# Patient Record
Sex: Male | Born: 1985 | Race: White | Hispanic: No | Marital: Married | Smoking: Never smoker
Health system: Southern US, Community
[De-identification: ages and names within clinical notes are randomized; demographics above are authoritative.]

## PROBLEM LIST (undated history)

## (undated) DIAGNOSIS — F988 Other specified behavioral and emotional disorders with onset usually occurring in childhood and adolescence: Secondary | ICD-10-CM

## (undated) DIAGNOSIS — F32A Depression, unspecified: Secondary | ICD-10-CM

## (undated) DIAGNOSIS — K219 Gastro-esophageal reflux disease without esophagitis: Secondary | ICD-10-CM

## (undated) DIAGNOSIS — T7840XA Allergy, unspecified, initial encounter: Secondary | ICD-10-CM

## (undated) DIAGNOSIS — F419 Anxiety disorder, unspecified: Secondary | ICD-10-CM

## (undated) DIAGNOSIS — E785 Hyperlipidemia, unspecified: Secondary | ICD-10-CM

## (undated) DIAGNOSIS — J069 Acute upper respiratory infection, unspecified: Secondary | ICD-10-CM

## (undated) DIAGNOSIS — R739 Hyperglycemia, unspecified: Secondary | ICD-10-CM

## (undated) DIAGNOSIS — K5792 Diverticulitis of intestine, part unspecified, without perforation or abscess without bleeding: Secondary | ICD-10-CM

## (undated) HISTORY — DX: Allergy, unspecified, initial encounter: T78.40XA

## (undated) HISTORY — DX: Acute upper respiratory infection, unspecified: J06.9

## (undated) HISTORY — DX: Diverticulitis of intestine, part unspecified, without perforation or abscess without bleeding: K57.92

## (undated) HISTORY — DX: Other specified behavioral and emotional disorders with onset usually occurring in childhood and adolescence: F98.8

## (undated) HISTORY — DX: Gastro-esophageal reflux disease without esophagitis: K21.9

## (undated) HISTORY — DX: Anxiety disorder, unspecified: F41.9

## (undated) HISTORY — DX: Hyperglycemia, unspecified: R73.9

## (undated) HISTORY — DX: Hyperlipidemia, unspecified: E78.5

## (undated) HISTORY — DX: Depression, unspecified: F32.A

---

## 2007-04-08 HISTORY — PX: SHOULDER SURGERY: SHX246

## 2020-11-05 ENCOUNTER — Encounter (HOSPITAL_BASED_OUTPATIENT_CLINIC_OR_DEPARTMENT_OTHER): Payer: Self-pay

## 2020-11-05 ENCOUNTER — Other Ambulatory Visit: Payer: Self-pay

## 2020-11-05 DIAGNOSIS — K5732 Diverticulitis of large intestine without perforation or abscess without bleeding: Secondary | ICD-10-CM | POA: Insufficient documentation

## 2020-11-05 DIAGNOSIS — R1084 Generalized abdominal pain: Secondary | ICD-10-CM | POA: Diagnosis present

## 2020-11-05 LAB — CBC
HCT: 43.9 % (ref 39.0–52.0)
Hemoglobin: 15.3 g/dL (ref 13.0–17.0)
MCH: 29.3 pg (ref 26.0–34.0)
MCHC: 34.9 g/dL (ref 30.0–36.0)
MCV: 84.1 fL (ref 80.0–100.0)
Platelets: 263 10*3/uL (ref 150–400)
RBC: 5.22 MIL/uL (ref 4.22–5.81)
RDW: 13.2 % (ref 11.5–15.5)
WBC: 12.1 10*3/uL — ABNORMAL HIGH (ref 4.0–10.5)
nRBC: 0 % (ref 0.0–0.2)

## 2020-11-05 LAB — COMPREHENSIVE METABOLIC PANEL
ALT: 20 U/L (ref 0–44)
AST: 20 U/L (ref 15–41)
Albumin: 4.3 g/dL (ref 3.5–5.0)
Alkaline Phosphatase: 48 U/L (ref 38–126)
Anion gap: 10 (ref 5–15)
BUN: 16 mg/dL (ref 6–20)
CO2: 24 mmol/L (ref 22–32)
Calcium: 9.4 mg/dL (ref 8.9–10.3)
Chloride: 104 mmol/L (ref 98–111)
Creatinine, Ser: 0.99 mg/dL (ref 0.61–1.24)
GFR, Estimated: >60 mL/min (ref >60)
Glucose, Bld: 106 mg/dL — ABNORMAL HIGH (ref 70–99)
Potassium: 3.9 mmol/L (ref 3.5–5.1)
Sodium: 138 mmol/L (ref 135–145)
Total Bilirubin: 1 mg/dL (ref 0.3–1.2)
Total Protein: 7.8 g/dL (ref 6.5–8.1)

## 2020-11-05 LAB — URINALYSIS, ROUTINE W REFLEX MICROSCOPIC
Bilirubin Urine: NEGATIVE
Glucose, UA: NEGATIVE mg/dL
Ketones, ur: NEGATIVE mg/dL
Leukocytes,Ua: NEGATIVE
Nitrite: NEGATIVE
Specific Gravity, Urine: 1.037 — ABNORMAL HIGH (ref 1.005–1.030)
pH: 6 (ref 5.0–8.0)

## 2020-11-05 LAB — LIPASE, BLOOD: Lipase: 37 U/L (ref 11–51)

## 2020-11-05 NOTE — ED Triage Notes (Signed)
Patient here POV from Home with ABD Pain.  Patient states pain is primarily located in the [place] and has been present and worsening for 24 Hours PTA.  No N/V/D. No CP, No SOB. Painful with Urination.

## 2020-11-06 ENCOUNTER — Emergency Department (HOSPITAL_BASED_OUTPATIENT_CLINIC_OR_DEPARTMENT_OTHER)
Admission: EM | Admit: 2020-11-06 | Discharge: 2020-11-06 | Disposition: A | Discharge status: Home / Self Care | Payer: BC Managed Care – PPO | Attending: Emergency Medicine | Admitting: Emergency Medicine

## 2020-11-06 ENCOUNTER — Encounter (HOSPITAL_BASED_OUTPATIENT_CLINIC_OR_DEPARTMENT_OTHER): Payer: Self-pay | Admitting: Radiology

## 2020-11-06 ENCOUNTER — Emergency Department (HOSPITAL_BASED_OUTPATIENT_CLINIC_OR_DEPARTMENT_OTHER): Payer: BC Managed Care – PPO

## 2020-11-06 DIAGNOSIS — K5732 Diverticulitis of large intestine without perforation or abscess without bleeding: Secondary | ICD-10-CM

## 2020-11-06 MED ORDER — METRONIDAZOLE 500 MG PO TABS: 500.0000 mg | ORAL_TABLET | Freq: Two times a day (BID) | ORAL | 0 refills | Status: DC

## 2020-11-06 MED ORDER — METRONIDAZOLE 500 MG PO TABS
500.0000 mg | ORAL_TABLET | Freq: Once | ORAL | Status: AC
  Administered 2020-11-06: 500 mg via ORAL
  Filled 2020-11-06: qty 1

## 2020-11-06 MED ORDER — IOHEXOL 300 MG/ML  SOLN
100.0000 mL | Freq: Once | INTRAMUSCULAR | Status: AC | PRN
  Administered 2020-11-06: 100 mL via INTRAVENOUS

## 2020-11-06 MED ORDER — CIPROFLOXACIN HCL 500 MG PO TABS
500.0000 mg | ORAL_TABLET | Freq: Once | ORAL | Status: AC
  Administered 2020-11-06: 500 mg via ORAL
  Filled 2020-11-06: qty 1

## 2020-11-06 MED ORDER — ONDANSETRON HCL 4 MG/2ML IJ SOLN
4.0000 mg | Freq: Once | INTRAMUSCULAR | Status: AC
  Administered 2020-11-06: 4 mg via INTRAVENOUS
  Filled 2020-11-06: qty 2

## 2020-11-06 MED ORDER — HYDROCODONE-ACETAMINOPHEN 5-325 MG PO TABS: 1.0000 | ORAL_TABLET | Freq: Four times a day (QID) | ORAL | 0 refills | Status: DC | PRN

## 2020-11-06 MED ORDER — CIPROFLOXACIN HCL 500 MG PO TABS: 500.0000 mg | ORAL_TABLET | Freq: Two times a day (BID) | ORAL | 0 refills | Status: DC

## 2020-11-06 MED ORDER — FENTANYL CITRATE (PF) 100 MCG/2ML IJ SOLN
100.0000 ug | Freq: Once | INTRAMUSCULAR | Status: AC
  Administered 2020-11-06: 100 ug via INTRAVENOUS
  Filled 2020-11-06: qty 2

## 2020-11-06 NOTE — ED Notes (Signed)
This RN presented the AVS utilizing Teachback Method. Patient verbalizes understanding of Discharge Instructions. Opportunity for Questioning and Answers were provided. Patient Discharged from ED ambulatory to Home via SELF.

## 2020-11-06 NOTE — ED Provider Notes (Signed)
[facility] Provider Note   CSN: [id number] Arrival date & time: 11/05/20  2142     History Chief Complaint  Patient presents with   Abdominal Pain    Christopher Carney is a 35 y.o. male.  The history is provided by the patient.  Abdominal Pain Pain location:  Generalized Pain quality: aching, bloating and cramping   Pain severity:  Moderate Onset quality:  Gradual Duration:  24 hours Timing:  Constant Progression:  Worsening Chronicity:  New Relieved by:  Nothing Worsened by:  Movement and palpation Associated symptoms: constipation and dysuria   Associated symptoms: no fever and no vomiting   Patient reports pain was generalized and now is focal in the right lower quadrant.  He reports feeling gas and feeling constipated      PMH-none Social History   Tobacco Use   Smoking status: Never   Smokeless tobacco: Never  Substance Use Topics   Alcohol use: Yes    Comment: Glass of Wine Daily   Drug use: Never    Home Medications Prior to Admission medications   Medication Sig Start Date End Date Taking? Authorizing Provider  ciprofloxacin (CIPRO) 500 MG tablet Take 1 tablet (500 mg total) by mouth every 12 (twelve) hours. 11/06/20  Yes Ripley Fraise, MD  HYDROcodone-acetaminophen (NORCO/VICODIN) 5-325 MG tablet Take 1 tablet by mouth every 6 (six) hours as needed for severe pain. 11/06/20  Yes Ripley Fraise, MD  metroNIDAZOLE (FLAGYL) 500 MG tablet Take 1 tablet (500 mg total) by mouth 2 (two) times daily. 11/06/20  Yes Ripley Fraise, MD    Allergies    Patient has no allergy information on record.  Review of Systems   Review of Systems  Constitutional:  Negative for fever.  Gastrointestinal:  Positive for abdominal pain and constipation. Negative for vomiting.  Genitourinary:  Positive for dysuria. Negative for testicular pain.  All other systems reviewed and are negative.  Physical Exam Updated Vital Signs BP 137/87 (BP  Location: Right Arm)   Pulse 80   Temp 98.1 F (36.7 C) (Oral)   Resp 20   Ht 1.829 m (6')   Wt 111.1 kg   SpO2 100%   BMI 33.23 kg/m   Physical Exam CONSTITUTIONAL: Well developed/well nourished HEAD: Normocephalic/atraumatic EYES: EOMI/PERRL, no icterus NECK: supple no meningeal signs SPINE/BACK:entire spine nontender CV: S1/S2 noted, no murmurs/rubs/gallops noted LUNGS: Lungs are clear to auscultation bilaterally, no apparent distress ABDOMEN: soft, mild RLQ tenderness, no rebound or guarding, bowel sounds noted throughout abdomen GU:no cva tenderness NEURO: Pt is awake/alert/appropriate, moves all extremitiesx4.  No facial droop.   EXTREMITIES: pulses normal/equal, full ROM SKIN: warm, color normal PSYCH: no abnormalities of mood noted, alert and oriented to situation  ED Results / Procedures / Treatments   Labs (all labs ordered are listed, but only abnormal results are displayed) Labs Reviewed  COMPREHENSIVE METABOLIC PANEL - Abnormal; Notable for the following components:      Result Value   Glucose, Bld 106 (*)    All other components within normal limits  CBC - Abnormal; Notable for the following components:   WBC 12.1 (*)    All other components within normal limits  URINALYSIS, ROUTINE W REFLEX MICROSCOPIC - Abnormal; Notable for the following components:   Specific Gravity, Urine 1.037 (*)    Hgb urine dipstick MODERATE (*)    Protein, ur TRACE (*)    All other components within normal limits  LIPASE, BLOOD    EKG None  Radiology CT ABDOMEN PELVIS W CONTRAST  Result Date: 11/06/2020 CLINICAL DATA:  Abdominal and scrotal pain EXAM: CT ABDOMEN AND PELVIS WITH CONTRAST TECHNIQUE: Multidetector CT imaging of the abdomen and pelvis was performed using the standard protocol following bolus administration of intravenous contrast. CONTRAST:  1105m OMNIPAQUE IOHEXOL 300 MG/ML  SOLN COMPARISON:  None. FINDINGS: LOWER CHEST: Normal. HEPATOBILIARY: Normal hepatic  contours. No intra- or extrahepatic biliary dilatation. The gallbladder is normal. PANCREAS: Normal pancreas. No ductal dilatation or peripancreatic fluid collection. SPLEEN: Normal. ADRENALS/URINARY TRACT: The adrenal glands are normal. No hydronephrosis, nephroureterolithiasis or solid renal mass. The urinary bladder is normal for degree of distention STOMACH/BOWEL: There is no hiatal hernia. Normal duodenal course and caliber. No small bowel dilatation or inflammation. There is rectosigmoid diverticulosis with acute inflammation. No free intraperitoneal air. Normal appendix. VASCULAR/LYMPHATIC: Normal course and caliber of the major abdominal vessels. No abdominal or pelvic lymphadenopathy. REPRODUCTIVE: Normal prostate size with symmetric seminal vesicles. Unremarkable scrotum. MUSCULOSKELETAL. No bony spinal canal stenosis or focal osseous abnormality. OTHER: None. IMPRESSION: Acute rectosigmoid diverticulitis without abscess or free intraperitoneal air. Electronically Signed   By: KUlyses JarredM.D.   On: 11/06/2020 02:35    Procedures Procedures   Medications Ordered in ED Medications  fentaNYL (SUBLIMAZE) injection 100 mcg (100 mcg Intravenous Given 11/06/20 0201)  ondansetron (ZOFRAN) injection 4 mg (4 mg Intravenous Given 11/06/20 0159)  iohexol (OMNIPAQUE) 300 MG/ML solution 100 mL (100 mLs Intravenous Contrast Given 11/06/20 0209)  ciprofloxacin (CIPRO) tablet 500 mg (500 mg Oral Given 11/06/20 0330)  metroNIDAZOLE (FLAGYL) tablet 500 mg (500 mg Oral Given 11/06/20 0330)    ED Course  I have reviewed the triage vital signs and the nursing notes.  Pertinent labs & imaging results that were available during my care of the patient were reviewed by me and considered in my medical decision making (see chart for details).    MDM Rules/Calculators/A&P                          3:52 AM Patient presents with lower abdominal pain.  I personally reviewed CT imaging, and it reveals  diverticulitis. Patient is improved, no vomiting.  No appendicitis. Advised need for follow-up with gastroenterology and will need to have a colonoscopy soon. Will place on Cipro and Flagyl.  Pain medications also prescribed Patient agreeable with plan.  We discussed return precautions Final Clinical Impression(s) / ED Diagnoses Final diagnoses:  Sigmoid diverticulitis    Rx / DC Orders ED Discharge Orders          Ordered    HYDROcodone-acetaminophen (NORCO/VICODIN) 5-325 MG tablet  Every 6 hours PRN        11/06/20 0350    ciprofloxacin (CIPRO) 500 MG tablet  Every 12 hours        11/06/20 0350    metroNIDAZOLE (FLAGYL) 500 MG tablet  2 times daily        11/06/20 0350             WRipley Fraise MD 11/06/20 [phone]

## 2021-05-15 ENCOUNTER — Encounter: Payer: Self-pay | Admitting: Internal Medicine

## 2021-07-15 ENCOUNTER — Ambulatory Visit: Payer: BC Managed Care – PPO | Admitting: Internal Medicine

## 2021-07-15 ENCOUNTER — Encounter: Payer: Self-pay | Admitting: Internal Medicine

## 2021-07-15 VITALS — BP 140/88 | HR 107 | Ht 73.0 in | Wt 244.0 lb

## 2021-07-15 DIAGNOSIS — K5732 Diverticulitis of large intestine without perforation or abscess without bleeding: Secondary | ICD-10-CM | POA: Diagnosis not present

## 2021-07-15 NOTE — Patient Instructions (Signed)
If you are age 35 or older, your body mass index should be between 23-30. Your Body mass index is 32.19 kg/m?Marland Kitchen If this is out of the aforementioned range listed, please consider follow up with your Primary Care Provider. ? ?If you are age 41 or younger, your body mass index should be between 19-25. Your Body mass index is 32.19 kg/m?Marland Kitchen If this is out of the aformentioned range listed, please consider follow up with your Primary Care Provider.  ? ?You have been scheduled for a colonoscopy. Please follow written instructions given to you at your visit today.  ?Please pick up your prep supplies at the pharmacy within the next 1-3 days. ?If you use inhalers (even only as needed), please bring them with you on the day of your procedure.  ? ?________________________________________________________ ? ?The [facility] providers would like to encourage you to use [facility] to communicate with providers for non-urgent requests or questions.  Due to long hold times on the telephone, sending your provider a message by [facility] may be a faster and more efficient way to get a response.  Please allow 48 business hours for a response.  Please remember that this is for non-urgent requests.  ?_______________________________________________________ ? ?It was a pleasure to see you today! ? ?Thank you for trusting me with your gastrointestinal care!   ? ? ? ?

## 2021-07-15 NOTE — Progress Notes (Signed)
? ?Chief Complaint: Diverticulitis follow up ? ?HPI : 36 year male with history of diverticulitis or ADHD presents to follow up on his diverticulitis diagnosis. ? ?He was diagnosed with diverticulitis in 10/2020-11/2020. He had intense abdominal pain with the onset of diverticulitis.  He completed a course of antibiotics for his diverticulitis.  His abdominal pain is resolved since then.  Denies any current abdominal pain. Denies blood in stools or weight loss. Denies diarrhea or constipation. Denies N&V, dysphagia. Denies prior colonoscopy. Denies blood thinners. Denies use of NSAIDs. Grandfather had colon cancer in 47s. He just had a 77 week old baby. He teachers high school English ? ?Past Medical History:  ?Diagnosis Date  ? Attention deficit disorder   ? Diverticulitis   ? Hyperglycemia   ? Upper respiratory tract infection   ? ?Past Surgical History:  ?Procedure Laterality Date  ? SHOULDER SURGERY  2009  ? ?Family History  ?Problem Relation Age of Onset  ? Fibromyalgia Mother   ? Hypothyroidism Mother   ? Hypertension Father   ? Hyperlipidemia Father   ? Diabetes Maternal Grandfather   ? Colon cancer Maternal Grandfather   ?     x2  ? Hyperlipidemia Paternal Grandfather   ? Heart attack Paternal Grandfather   ? Stomach cancer Neg Hx   ? Esophageal cancer Neg Hx   ? Colon polyps Neg Hx   ? ?Social History  ? ?Tobacco Use  ? Smoking status: Never  ? Smokeless tobacco: Current  ?Vaping Use  ? Vaping Use: Never used  ?Substance Use Topics  ? Alcohol use: Yes  ?  Comment: Occasionally  ? Drug use: Never  ? ?Current Outpatient Medications  ?Medication Sig Dispense Refill  ? allopurinol (ZYLOPRIM) 100 MG tablet Take 200 mg by mouth as needed.    ? amphetamine-dextroamphetamine (ADDERALL) 15 MG tablet Take 1 tablet by mouth 3 (three) times daily.    ? ciprofloxacin (CIPRO) 500 MG tablet Take 1 tablet (500 mg total) by mouth every 12 (twelve) hours. 20 tablet 0  ? escitalopram (LEXAPRO) 10 MG tablet Take 10 mg by mouth  daily.    ? indomethacin (INDOCIN) 50 MG capsule 1 capsule with food or milk    ? metroNIDAZOLE (FLAGYL) 500 MG tablet Take 1 tablet (500 mg total) by mouth 2 (two) times daily. 20 tablet 0  ? Omega-3 Fatty Acids (FISH OIL) 1000 MG CAPS 1 capsule    ? rosuvastatin (CRESTOR) 10 MG tablet Take 10 mg by mouth at bedtime.    ? ?No current facility-administered medications for this visit.  ? ?Not on File ? ?Review of Systems: ?All systems reviewed and negative except where noted in HPI.  ? ?Physical Exam: ?BP 140/88   Pulse (!) 107   Ht '6\' 1"'$  (1.854 m)   Wt 244 lb (110.7 kg)   SpO2 97%   BMI 32.19 kg/m?  ?Constitutional: Pleasant,well-developed, male in no acute distress. ?HEENT: Normocephalic and atraumatic. Conjunctivae are normal. No scleral icterus. ?Cardiovascular: Normal rate, regular rhythm.  ?Pulmonary/chest: Effort normal and breath sounds normal. No wheezing, rales or rhonchi. ?Abdominal: Soft, nondistended, nontender. Bowel sounds active throughout. There are no masses palpable. No hepatomegaly. ?Extremities: No edema ?Neurological: Alert and oriented to person place and time. ?Skin: Skin is warm and dry. No rashes noted. ?Psychiatric: Normal mood and affect. Behavior is normal. ? ?Labs 11/2020: CBC with elevated WBC of 12.1. CMP and lipase unremarkable ? ?CT A/P w/contrast 11/06/20: ?IMPRESSION: ?Acute rectosigmoid diverticulitis without abscess or  free ?intraperitoneal air. ? ?ASSESSMENT AND PLAN: ?Diverticulitis ?Fam hx of colon cancer in grandfather ?Patient presents for follow-up after an episode of diverticulitis.  Denies any residual abdominal discomfort.  Patient does describe some family history of colon cancer in his grandfather. I went over the colonoscopy procedure in detail with patient, and he is agreeable to proceeding. ?- Colonoscopy [facility] ? ?Christia Reading, MD ? ?

## 2021-09-12 ENCOUNTER — Encounter: Payer: Self-pay | Admitting: Internal Medicine

## 2021-09-19 ENCOUNTER — Encounter: Payer: Self-pay | Admitting: Internal Medicine

## 2021-09-19 ENCOUNTER — Ambulatory Visit (AMBULATORY_SURGERY_CENTER): Payer: BC Managed Care – PPO | Admitting: Internal Medicine

## 2021-09-19 VITALS — BP 118/80 | HR 68 | Temp 98.4°F | Resp 12 | Ht 73.0 in | Wt 244.0 lb

## 2021-09-19 DIAGNOSIS — D123 Benign neoplasm of transverse colon: Secondary | ICD-10-CM

## 2021-09-19 DIAGNOSIS — D125 Benign neoplasm of sigmoid colon: Secondary | ICD-10-CM | POA: Diagnosis not present

## 2021-09-19 DIAGNOSIS — K5730 Diverticulosis of large intestine without perforation or abscess without bleeding: Secondary | ICD-10-CM | POA: Diagnosis not present

## 2021-09-19 DIAGNOSIS — K648 Other hemorrhoids: Secondary | ICD-10-CM

## 2021-09-19 DIAGNOSIS — K5732 Diverticulitis of large intestine without perforation or abscess without bleeding: Secondary | ICD-10-CM | POA: Diagnosis not present

## 2021-09-19 DIAGNOSIS — K635 Polyp of colon: Secondary | ICD-10-CM | POA: Diagnosis not present

## 2021-09-19 MED ORDER — SODIUM CHLORIDE 0.9 % IV SOLN: 500.0000 mL | Freq: Once | INTRAVENOUS | Status: DC

## 2021-09-19 NOTE — Progress Notes (Signed)
To pacu, VSS. Report to RN.tb 

## 2021-09-19 NOTE — Op Note (Signed)
[facility] Patient Name: Rodderick Holtzer Procedure Date: 09/19/2021 8:05 AM MRN: [id number] Endoscopist: Sonny Masters "Christia Reading ,  Age: 36 Referring MD:  Date of Birth: 09/24/1985 Gender: Male Account #: [id number] Procedure:                Colonoscopy Indications:              Follow-up of diverticulitis Medicines:                Monitored Anesthesia Care Procedure:                Pre-Anesthesia Assessment:                           - Prior to the procedure, a History and Physical                            was performed, and patient medications and                            allergies were reviewed. The patient's tolerance of                            previous anesthesia was also reviewed. The risks                            and benefits of the procedure and the sedation                            options and risks were discussed with the patient.                            All questions were answered, and informed consent                            was obtained. Prior Anticoagulants: The patient has                            taken no previous anticoagulant or antiplatelet                            agents. ASA Grade Assessment: II - A patient with                            mild systemic disease. After reviewing the risks                            and benefits, the patient was deemed in                            satisfactory condition to undergo the procedure.                           After obtaining informed consent, the colonoscope  was passed under direct vision. Throughout the                            procedure, the patient's blood pressure, pulse, and                            oxygen saturations were monitored continuously. The                            CF [id number] #[id number] was introduced through the anus                            and advanced to the the terminal ileum. The                            colonoscopy was performed without  difficulty. The                            patient tolerated the procedure well. The quality                            of the bowel preparation was good. The terminal                            ileum, ileocecal valve, appendiceal orifice, and                            rectum were photographed. Scope In: 8:09:23 AM Scope Out: 8:53:22 AM Scope Withdrawal Time: 0 hours 39 minutes 54 seconds  Total Procedure Duration: 0 hours 43 minutes 59 seconds  Findings:                 The terminal ileum appeared normal.                           A 2 mm polyp was found in the transverse colon. The                            polyp was sessile. The polyp was removed with a                            cold biopsy forceps. Resection and retrieval were                            complete.                           A 4 mm polyp was found in the transverse colon. The                            polyp was sessile. The polyp was removed with a                            cold snare. Resection and retrieval  were complete.                           A 3 mm polyp was found in the sigmoid colon. The                            polyp was sessile. The polyp was removed with a                            cold snare. Resection and retrieval were complete.                           A 15 mm polyp was found in the sigmoid colon. The                            polyp was sessile. The polyp was removed with a                            piecemeal technique using a cold snare. Resection                            and retrieval were complete. Area was tattooed with                            an injection of Spot (carbon black).                           Multiple small and large-mouthed diverticula were                            found in the sigmoid colon and descending colon.                           Non-bleeding internal hemorrhoids were found during                            retroflexion. Complications:            No immediate  complications. Estimated Blood Loss:     Estimated blood loss: none. Estimated blood loss                            was minimal. Impression:               - The examined portion of the ileum was normal.                           - One 2 mm polyp in the transverse colon, removed                            with a cold biopsy forceps. Resected and retrieved.                           - One 4 mm polyp in the transverse colon,  removed                            with a cold snare. Resected and retrieved.                           - One 3 mm polyp in the sigmoid colon, removed with                            a cold snare. Resected and retrieved.                           - One 15 mm polyp in the sigmoid colon, removed                            piecemeal using a cold snare. Resected and                            retrieved. Tattooed.                           - Diverticulosis in the sigmoid colon and in the                            descending colon.                           - Non-bleeding internal hemorrhoids. Recommendation:           - Discharge patient to home (with escort).                           - Await pathology results.                           - The findings and recommendations were discussed                            with the patient. Sonny Masters "Christia Reading,  09/19/2021 9:03:04 AM

## 2021-09-19 NOTE — Progress Notes (Signed)
Called to room to assist during endoscopic procedure.  Patient ID and intended procedure confirmed with present staff. Received instructions for my participation in the procedure from the performing physician.  

## 2021-09-19 NOTE — Progress Notes (Signed)
GASTROENTEROLOGY PROCEDURE H&P NOTE   Primary Care Physician: Michael Boston, MD    Reason for Procedure:   History of diverticulitis  Plan:    Colonoscopy  Patient is appropriate for endoscopic procedure(s) in the ambulatory ([facility]) setting.  The nature of the procedure, as well as the risks, benefits, and alternatives were carefully and thoroughly reviewed with the patient. Ample time for discussion and questions allowed. The patient understood, was satisfied, and agreed to proceed.     HPI: Christopher Carney is a 36 y.o. male who presents for colonoscopy for evaluation of diverticulitis .  Patient was most recently seen in the Gastroenterology Clinic on 07/15/21 .  No interval change in medical history since that appointment. Please refer to that note for full details regarding GI history and clinical presentation.   Past Medical History:  Diagnosis Date   Allergy    Anxiety    Attention deficit disorder    Depression    Diverticulitis    GERD (gastroesophageal reflux disease)    Hyperglycemia    Hyperlipidemia    Upper respiratory tract infection     Past Surgical History:  Procedure Laterality Date   SHOULDER SURGERY  2009    Prior to Admission medications   Medication Sig Start Date End Date Taking? Authorizing Provider  amphetamine-dextroamphetamine (ADDERALL) 15 MG tablet Take 1 tablet by mouth 3 (three) times daily. 06/17/21  Yes [provider]  Omega-3 Fatty Acids (FISH OIL) 1000 MG CAPS 1 capsule 06/27/20  Yes [provider]  rosuvastatin (CRESTOR) 10 MG tablet Take 10 mg by mouth at bedtime. 07/14/21  Yes [provider]  allopurinol (ZYLOPRIM) 100 MG tablet Take 200 mg by mouth as needed. 06/03/21   [provider]  FLOWFLEX COVID-19 AG HOME TEST KIT FOLLOW THE test instructions FOR using THE at-home covid test. 07/26/21   [provider]  indomethacin (INDOCIN) 50 MG capsule 1 capsule with food or milk 06/27/20    [provider]  sertraline (ZOLOFT) 50 MG tablet Take 50 mg by mouth daily. 08/02/21   [provider]    Current Outpatient Medications  Medication Sig Dispense Refill   amphetamine-dextroamphetamine (ADDERALL) 15 MG tablet Take 1 tablet by mouth 3 (three) times daily.     Omega-3 Fatty Acids (FISH OIL) 1000 MG CAPS 1 capsule     rosuvastatin (CRESTOR) 10 MG tablet Take 10 mg by mouth at bedtime.     allopurinol (ZYLOPRIM) 100 MG tablet Take 200 mg by mouth as needed.     FLOWFLEX COVID-19 AG HOME TEST KIT FOLLOW THE test instructions FOR using THE at-home covid test.     indomethacin (INDOCIN) 50 MG capsule 1 capsule with food or milk     sertraline (ZOLOFT) 50 MG tablet Take 50 mg by mouth daily.     Current Facility-Administered Medications  Medication Dose Route Frequency Provider Last Rate Last Admin   0.9 %  sodium chloride infusion  500 mL Intravenous Once Sharyn Creamer, MD        Allergies as of 09/19/2021   (Not on File)    Family History  Problem Relation Age of Onset   Fibromyalgia Mother    Hypothyroidism Mother    Hypertension Father    Hyperlipidemia Father    Diabetes Maternal Grandfather    Colon cancer Maternal Grandfather        x2   Hyperlipidemia Paternal Grandfather    Heart attack Paternal Grandfather  Stomach cancer Neg Hx    Esophageal cancer Neg Hx    Colon polyps Neg Hx    Ulcerative colitis Neg Hx     Social History   Socioeconomic History   Marital status: Married    Spouse name: Not on file   Number of children: 1   Years of education: Not on file   Highest education level: Not on file  Occupational History   Occupation: Teacher/Coach  Tobacco Use   Smoking status: Never   Smokeless tobacco: Current    Types: Snuff  Vaping Use   Vaping Use: Never used  Substance and Sexual Activity   Alcohol use: Yes    Comment: Occasionally   Drug use: Never   Sexual activity: Yes  Other Topics Concern   Not on file   Social History Narrative   Not on file   Social Determinants of Health   Financial Resource Strain: Not on file  Food Insecurity: Not on file  Transportation Needs: Not on file  Physical Activity: Not on file  Stress: Not on file  Social Connections: Not on file  Intimate Partner Violence: Not on file    Physical Exam: Vital signs in last 24 hours: BP 123/77   Pulse 88   Temp 98.4 F (36.9 C)   Ht _0  (1.854 m)   Wt 244 lb (110.7 kg)   SpO2 96%   BMI 32.19 kg/m  GEN: NAD EYE: Sclerae anicteric ENT: MMM CV: Non-tachycardic Pulm: No increased WOB GI: Soft NEURO:  Alert & Oriented   Christia Reading, MD [facility]   09/19/2021 8:04 AM

## 2021-09-19 NOTE — Patient Instructions (Signed)
Handouts on polyps and diverticulosis given to patient.  Await pathology results. Resume previous diet and continue present medications. Repeat colonoscopy for surveillance will be determined based off of pathology results.   YOU HAD AN ENDOSCOPIC PROCEDURE TODAY AT [facility]:   Refer to the procedure report that was given to you for any specific questions about what was found during the examination.  If the procedure report does not answer your questions, please call your gastroenterologist to clarify.  If you requested that your care partner not be given the details of your procedure findings, then the procedure report has been included in a sealed envelope for you to review at your convenience later.  YOU SHOULD EXPECT: Some feelings of bloating in the abdomen. Passage of more gas than usual.  Walking can help get rid of the air that was put into your GI tract during the procedure and reduce the bloating. If you had a lower endoscopy (such as a colonoscopy or flexible sigmoidoscopy) you may notice spotting of blood in your stool or on the toilet paper. If you underwent a bowel prep for your procedure, you may not have a normal bowel movement for a few days.  Please Note:  You might notice some irritation and congestion in your nose or some drainage.  This is from the oxygen used during your procedure.  There is no need for concern and it should clear up in a day or so.  SYMPTOMS TO REPORT IMMEDIATELY:  Following lower endoscopy (colonoscopy or flexible sigmoidoscopy):  Excessive amounts of blood in the stool  Significant tenderness or worsening of abdominal pains  Swelling of the abdomen that is new, acute  Fever of 100F or higher  For urgent or emergent issues, a gastroenterologist can be reached at any hour by calling [phone]. Do not use MyChart messaging for urgent concerns.    DIET:  We do recommend a small meal at first, but then you may proceed to your  regular diet.  Drink plenty of fluids but you should avoid alcoholic beverages for 24 hours.  ACTIVITY:  You should plan to take it easy for the rest of today and you should NOT DRIVE or use heavy machinery until tomorrow (because of the sedation medicines used during the test).    FOLLOW UP: Our staff will call the number listed on your records 24-72 hours following your procedure to check on you and address any questions or concerns that you may have regarding the information given to you following your procedure. If we do not reach you, we will leave a message.  We will attempt to reach you two times.  During this call, we will ask if you have developed any symptoms of COVID 19. If you develop any symptoms (ie: fever, flu-like symptoms, shortness of breath, cough etc.) before then, please call [phone].  If you test positive for Covid 19 in the 2 weeks post procedure, please call and report this information to [place].    If any biopsies were taken you will be contacted by phone or by letter within the next 1-3 weeks.  Please call us at [phone] if you have not heard about the biopsies in 3 weeks.    SIGNATURES/CONFIDENTIALITY: You and/or your care partner have signed paperwork which will be entered into your electronic medical record.  These signatures attest to the fact that that the information above on your After Visit Summary has been reviewed and is understood.  Full responsibility  of the confidentiality of this discharge information lies with you and/or your care-partner.

## 2021-09-23 ENCOUNTER — Encounter: Payer: Self-pay | Admitting: Internal Medicine

## 2021-09-23 ENCOUNTER — Telehealth: Payer: Self-pay

## 2021-09-23 NOTE — Telephone Encounter (Signed)
Left message on follow up call. 

## 2021-09-23 NOTE — Telephone Encounter (Signed)
  Follow up Call-     09/19/2021    7:22 AM  Call back number  Post procedure Call Back phone  # [phone] cell  Permission to leave phone message Yes     Patient questions:  Do you have a fever, pain , or abdominal swelling? No. Pain Score  0 *  Have you tolerated food without any problems? Yes.    Have you been able to return to your normal activities? Yes.    Do you have any questions about your discharge instructions: Diet   No. Medications  No. Follow up visit  No.  Do you have questions or concerns about your Care? No.  Actions: * If pain score is 4 or above: No action needed, pain <4.

## 2023-03-22 IMAGING — CT CT ABD-PELV W/ CM
2 of 7 series · 13 of 46 positions shown, 15 images · IV contrast (APPLIED)
Comparison: None.

CLINICAL DATA: Abdominal and scrotal pain

EXAM:
CT ABDOMEN AND PELVIS WITH CONTRAST
TECHNIQUE: Multidetector CT imaging of the abdomen and pelvis was performed
using the standard protocol following bolus administration of
intravenous contrast.
CONTRAST:  100mL OMNIPAQUE IOHEXOL 300 MG/ML  SOLN

[Series 2: abd pel w · axial · 0.85mm/px · z∈[-1047,-647]mm · 10 of 91 slices shown, 12 images]
[im 6/91  soft-tissue]
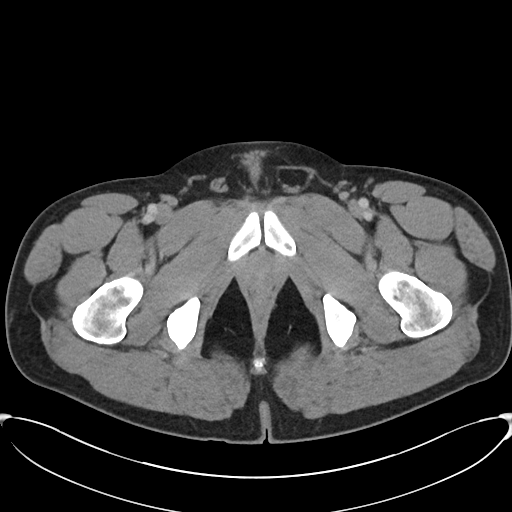
[im 6/91  bone]
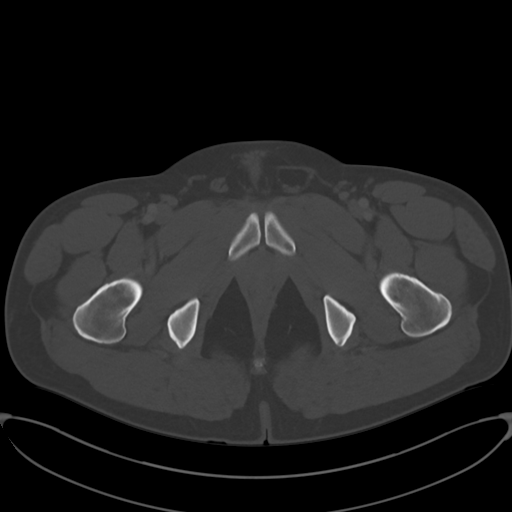
[im 16/91  soft-tissue]
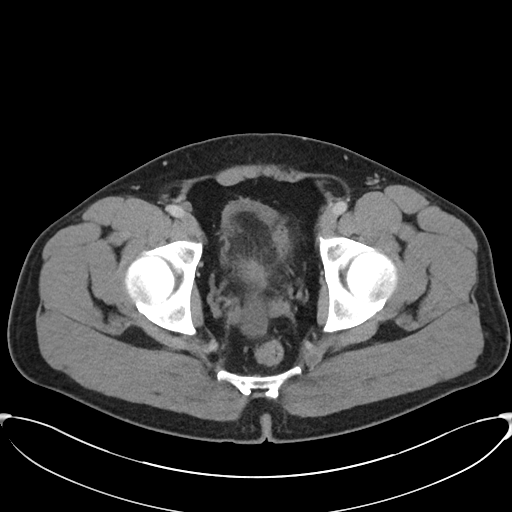
[im 26/91  soft-tissue]
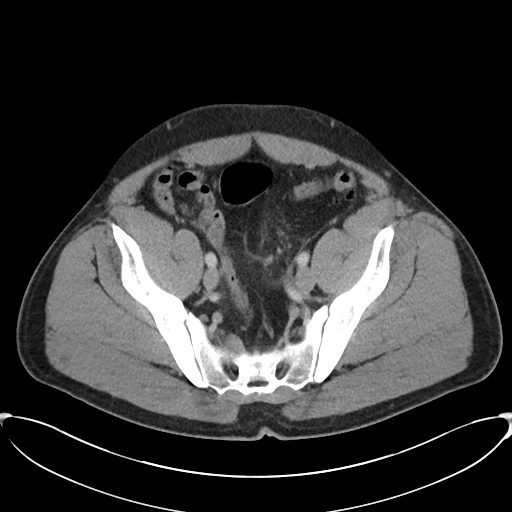
[im 31/91  soft-tissue]
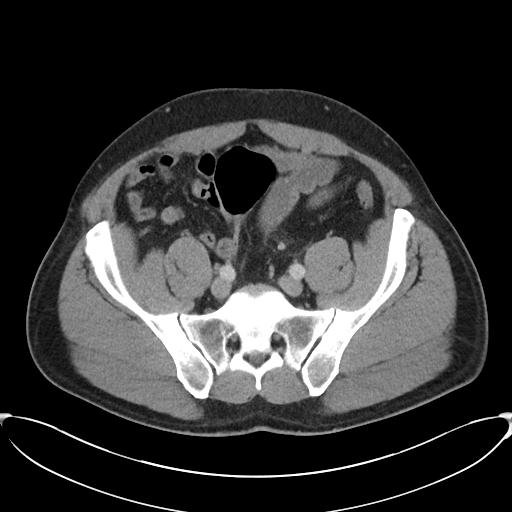
[im 41/91  soft-tissue]
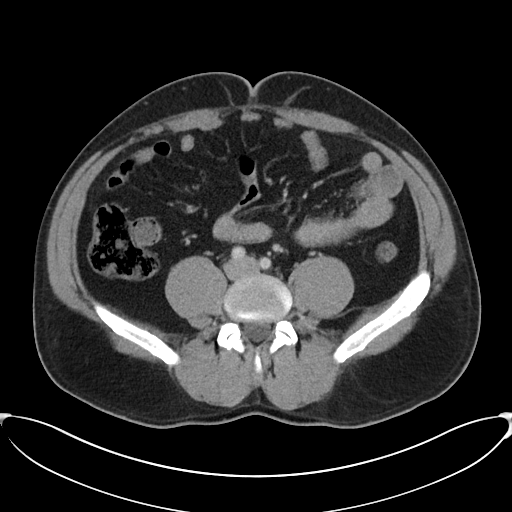
[im 51/91  soft-tissue]
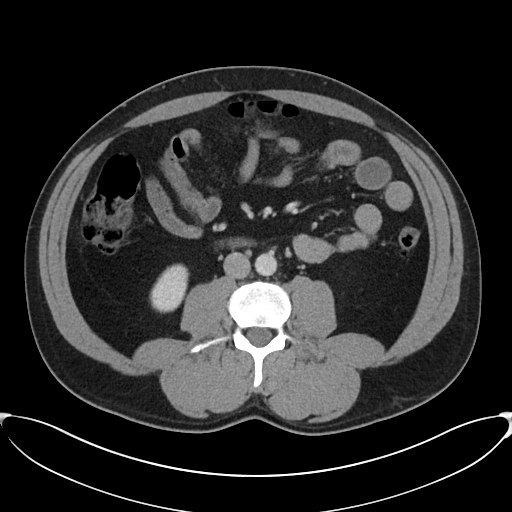
[im 61/91  soft-tissue]
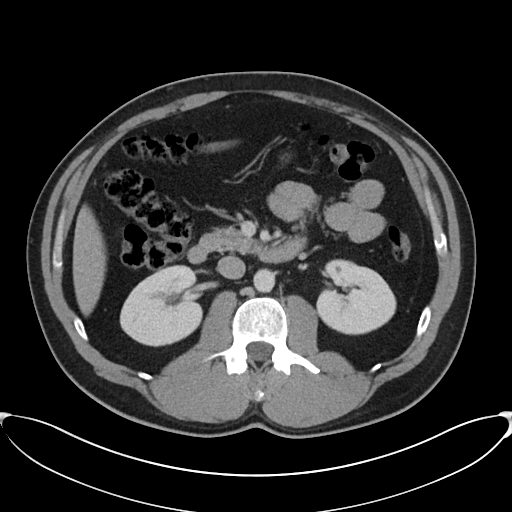
[im 66/91  soft-tissue]
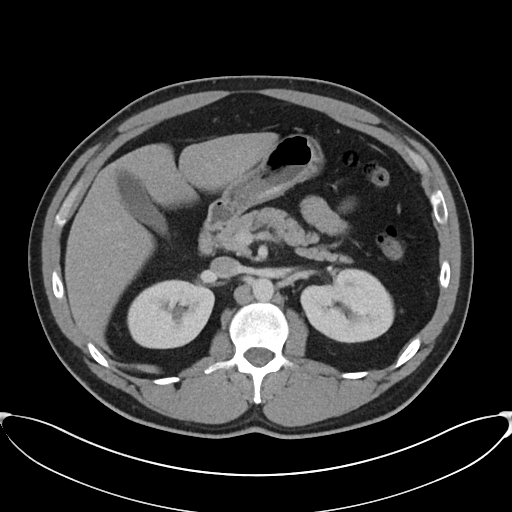
[im 76/91  soft-tissue]
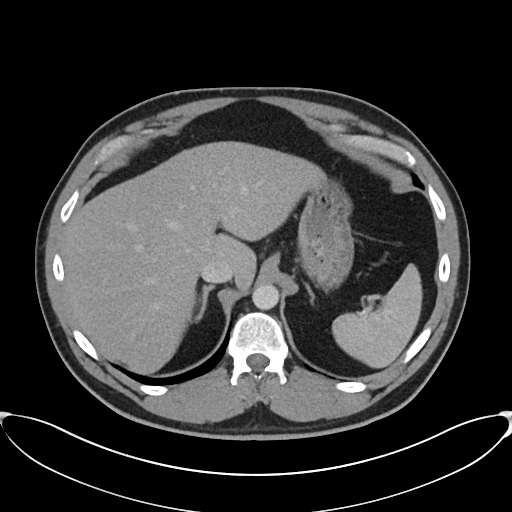
[im 76/91  bone]
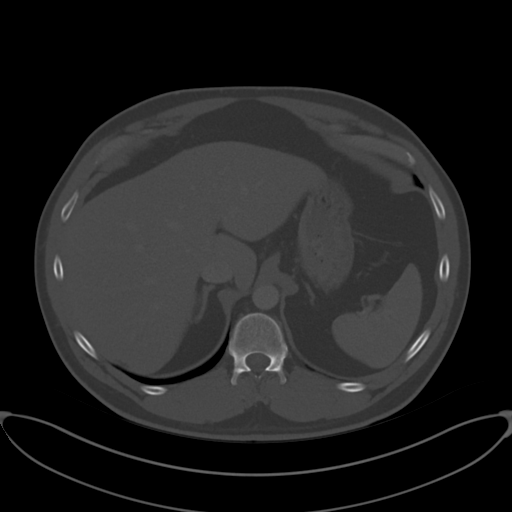
[im 86/91  soft-tissue]
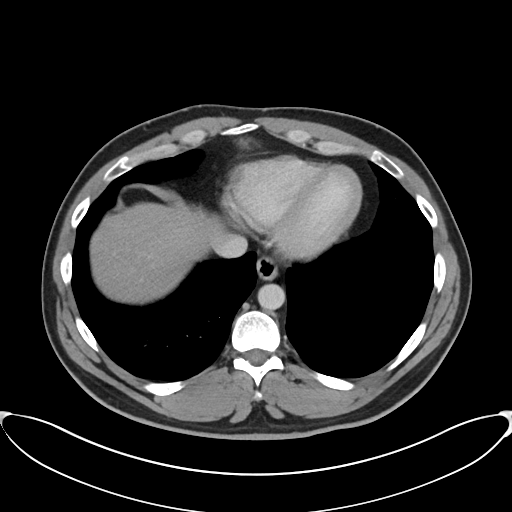

[Series 9: coronal · coronal · 0.36mm/px · 3 of 88 slices shown]
[im 22/88  soft-tissue]
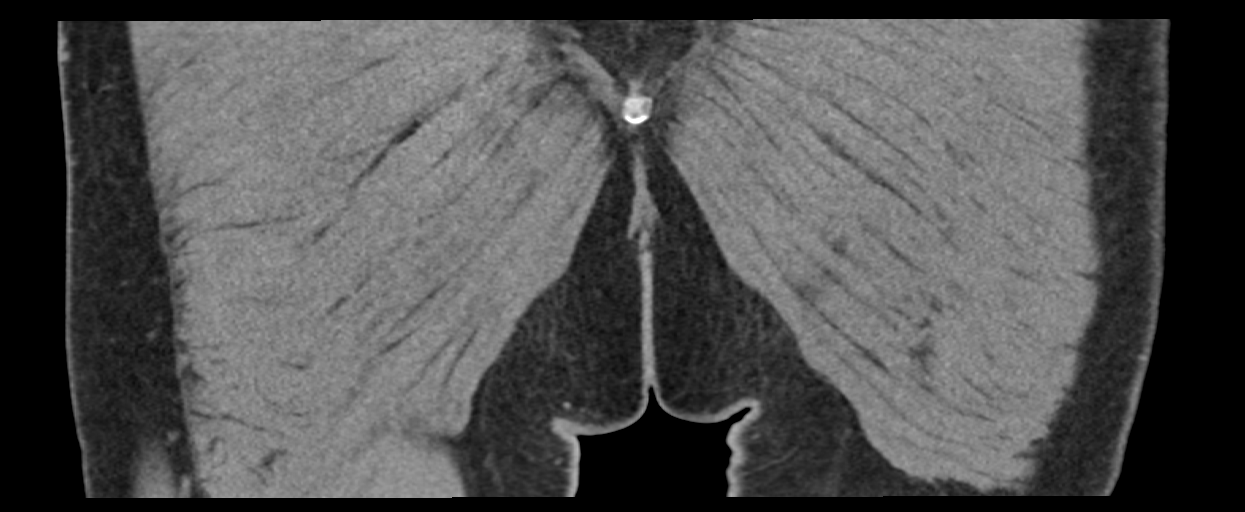
[im 44/88  soft-tissue]
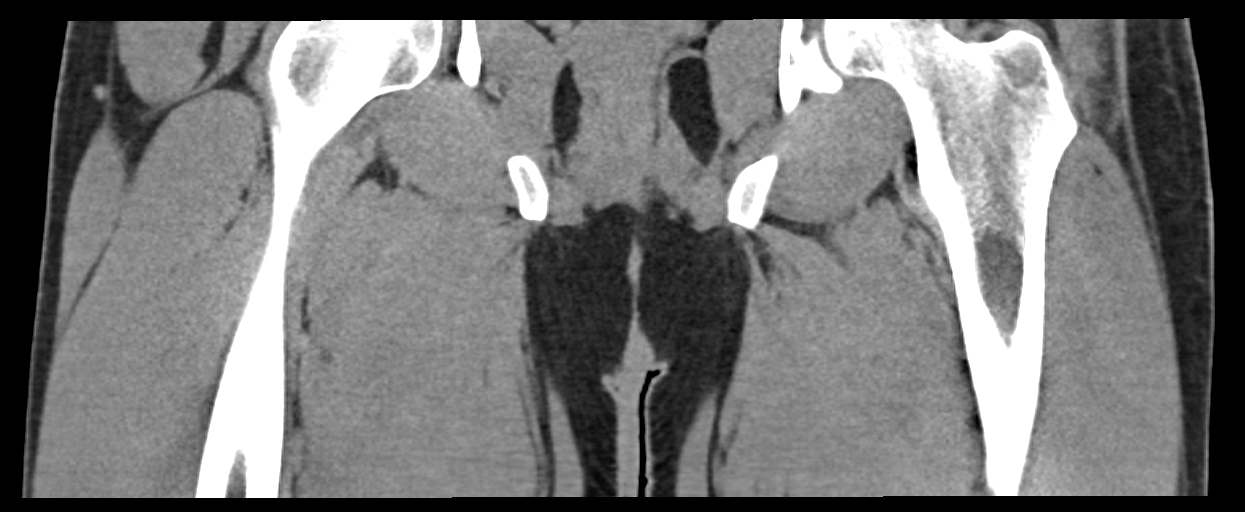
[im 66/88  soft-tissue]
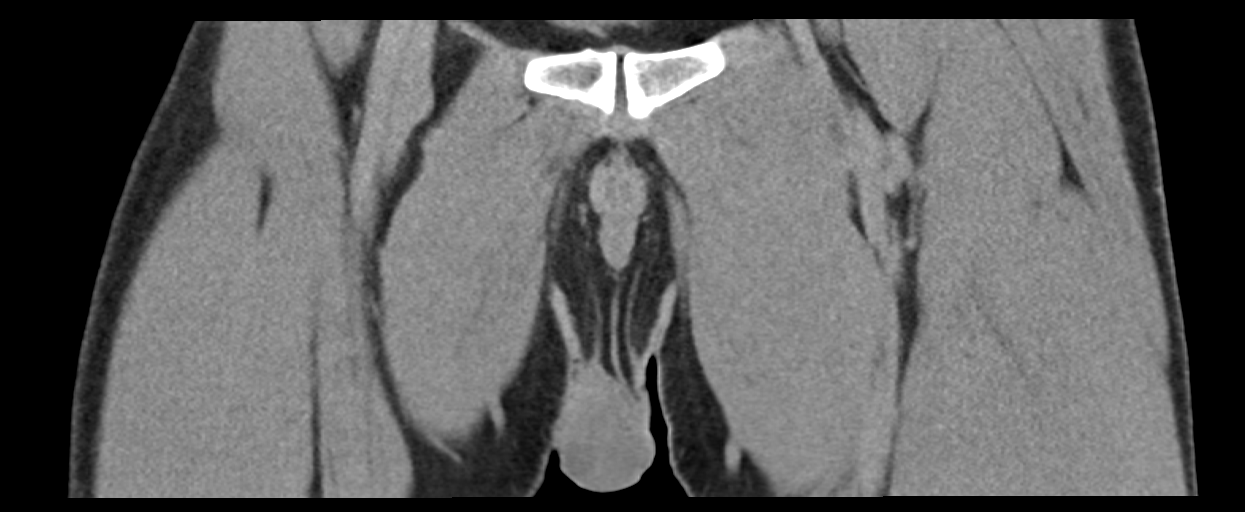

[13 of 46 positions shown; findings below may reference images not displayed]

FINDINGS: LOWER CHEST: Normal.

HEPATOBILIARY: Normal hepatic contours. No intra- or extrahepatic
biliary dilatation. The gallbladder is normal.

PANCREAS: Normal pancreas. No ductal dilatation or peripancreatic
fluid collection.

SPLEEN: Normal.

ADRENALS/URINARY TRACT: The adrenal glands are normal. No
hydronephrosis, nephroureterolithiasis or solid renal mass. The
urinary bladder is normal for degree of distention

STOMACH/BOWEL: There is no hiatal hernia. Normal duodenal course and
caliber. No small bowel dilatation or inflammation. There is
rectosigmoid diverticulosis with acute inflammation. No free
intraperitoneal air. Normal appendix.

VASCULAR/LYMPHATIC: Normal course and caliber of the major abdominal
vessels. No abdominal or pelvic lymphadenopathy.

REPRODUCTIVE: Normal prostate size with symmetric seminal vesicles.
Unremarkable scrotum.

MUSCULOSKELETAL. No bony spinal canal stenosis or focal osseous
abnormality.

OTHER: None.
IMPRESSION: Acute rectosigmoid diverticulitis without abscess or free
intraperitoneal air.

## 2023-11-19 NOTE — Progress Notes (Deleted)
 11/19/2023 Christopher Carney [id number] 05-03-1985  Referring provider: Stephane Leita DEL, MD Primary GI doctor: Dr. Federico  ASSESSMENT AND PLAN:  Diarrhea -KUB to evaluate for stool burden/obstruction - stool samples to rule out infection, giardia testing  -CRP/ESR to rule out inflammation, consider fecal calprotectin if elevated - TSH  -Pancreatic elastase/fecal fat with risk factors and description of the stools  -TTG/IGA to evaluate for celiac disease.  -Can do trial of IBGARD daily,  {acantispasmodic:27420} as needed -Add on citracel/benefiber, FODMAP, trial off lactulose and lifestyle changes discussed -{acdiarrheameds:28488} - possible pelvic floor component, consider referral to PT, information given -{acegdcolon:28265::Will schedule for endoscopic evaluation, discussed with patient and agrees with plan.} -Consider SIBO testing or xifaxin trial pending results   History of diverticulitis 11/06/2020 CTAP W rectosigmoid diverticulitis without complication  Personal history of colon polyps Colonoscopy 09/19/2021 after diverticulitis normal TI, 2 mm polyp transverse colon, 4 mm polyp transverse colon 3 mm polyp sigmoid colon, 15 mm polyp sigmoid colon piecemeal removed, diverticulosis sigmoid nonbleeding internal hemorrhoids  Patient Care Team: Stephane Leita DEL, MD as PCP - General (Internal Medicine)  HISTORY OF PRESENT ILLNESS: 38 y.o. male with a past medical history listed below presents for evaluation of diarrhea.   *** Discussed the use of AI scribe software for clinical note transcription with the patient, who gave verbal consent to proceed.  History of Present Illness            He  reports that he has never smoked. His smokeless tobacco use includes snuff. He reports current alcohol use. He reports that he does not use drugs.  RELEVANT GI HISTORY, IMAGING AND LABS: Results          CBC    Component Value Date/Time   WBC 12.1 (H) 11/05/2020 2232   RBC 5.22  11/05/2020 2232   HGB 15.3 11/05/2020 2232   HCT 43.9 11/05/2020 2232   PLT 263 11/05/2020 2232   MCV 84.1 11/05/2020 2232   MCH 29.3 11/05/2020 2232   MCHC 34.9 11/05/2020 2232   RDW 13.2 11/05/2020 2232   No results for input(s): HGB in the last 8760 hours.  CMP     Component Value Date/Time   NA 138 11/05/2020 2232   K 3.9 11/05/2020 2232   CL 104 11/05/2020 2232   CO2 24 11/05/2020 2232   GLUCOSE 106 (H) 11/05/2020 2232   BUN 16 11/05/2020 2232   CREATININE 0.99 11/05/2020 2232   CALCIUM 9.4 11/05/2020 2232   PROT 7.8 11/05/2020 2232   ALBUMIN 4.3 11/05/2020 2232   AST 20 11/05/2020 2232   ALT 20 11/05/2020 2232   ALKPHOS 48 11/05/2020 2232   BILITOT 1.0 11/05/2020 2232   GFRNONAA >60 11/05/2020 2232      Latest Ref Rng & Units 11/05/2020   10:32 PM  Hepatic Function  Total Protein 6.5 - 8.1 g/dL 7.8   Albumin 3.5 - 5.0 g/dL 4.3   AST 15 - 41 U/L 20   ALT 0 - 44 U/L 20   Alk Phosphatase 38 - 126 U/L 48   Total Bilirubin 0.3 - 1.2 mg/dL 1.0       Current Medications:    Current Outpatient Medications (Cardiovascular):    rosuvastatin (CRESTOR) 10 MG tablet, Take 10 mg by mouth at bedtime.   Current Outpatient Medications (Analgesics):    allopurinol (ZYLOPRIM) 100 MG tablet, Take 200 mg by mouth as needed.   indomethacin (INDOCIN) 50 MG capsule, 1 capsule with food  or milk   Current Outpatient Medications (Other):    amphetamine-dextroamphetamine (ADDERALL) 15 MG tablet, Take 1 tablet by mouth 3 (three) times daily.   FLOWFLEX COVID-19 AG HOME TEST KIT, FOLLOW THE test instructions FOR using THE at-home covid test.   Omega-3 Fatty Acids (FISH OIL) 1000 MG CAPS, 1 capsule   sertraline (ZOLOFT) 50 MG tablet, Take 50 mg by mouth daily.  Medical History:  Past Medical History:  Diagnosis Date   Allergy    Anxiety    Attention deficit disorder    Depression    Diverticulitis    GERD (gastroesophageal reflux disease)    Hyperglycemia     Hyperlipidemia    Upper respiratory tract infection    Allergies: Not on File   Surgical History:  He  has a past surgical history that includes Shoulder surgery (2009). Family History:  His family history includes Colon cancer in his maternal grandfather; Diabetes in his maternal grandfather; Fibromyalgia in his mother; Heart attack in his paternal grandfather; Hyperlipidemia in his father and paternal grandfather; Hypertension in his father; Hypothyroidism in his mother.  REVIEW OF SYSTEMS  : All other systems reviewed and negative except where noted in the History of Present Illness.  PHYSICAL EXAM: There were no vitals taken for this visit. Physical Exam          Alan JONELLE Coombs, PA-C 3:57 PM

## 2023-11-20 ENCOUNTER — Ambulatory Visit: Payer: Self-pay | Admitting: Physician Assistant

## 2024-01-18 ENCOUNTER — Other Ambulatory Visit (INDEPENDENT_AMBULATORY_CARE_PROVIDER_SITE_OTHER)

## 2024-01-18 ENCOUNTER — Ambulatory Visit: Payer: Self-pay | Admitting: Physician Assistant

## 2024-01-18 ENCOUNTER — Encounter: Payer: Self-pay | Admitting: Physician Assistant

## 2024-01-18 VITALS — BP 140/80 | HR 84 | Ht 73.0 in | Wt 251.0 lb

## 2024-01-18 DIAGNOSIS — R197 Diarrhea, unspecified: Secondary | ICD-10-CM | POA: Diagnosis not present

## 2024-01-18 DIAGNOSIS — Z8719 Personal history of other diseases of the digestive system: Secondary | ICD-10-CM

## 2024-01-18 DIAGNOSIS — R140 Abdominal distension (gaseous): Secondary | ICD-10-CM | POA: Diagnosis not present

## 2024-01-18 DIAGNOSIS — Z860101 Personal history of adenomatous and serrated colon polyps: Secondary | ICD-10-CM

## 2024-01-18 LAB — CBC WITH DIFFERENTIAL/PLATELET
Basophils Absolute: 0.1 K/uL (ref 0.0–0.1)
Basophils Relative: 0.9 % (ref 0.0–3.0)
Eosinophils Absolute: 0.1 K/uL (ref 0.0–0.7)
Eosinophils Relative: 1.1 % (ref 0.0–5.0)
HCT: 44.6 % (ref 39.0–52.0)
Hemoglobin: 15 g/dL (ref 13.0–17.0)
Lymphocytes Relative: 38 % (ref 12.0–46.0)
Lymphs Abs: 2.7 K/uL (ref 0.7–4.0)
MCHC: 33.7 g/dL (ref 30.0–36.0)
MCV: 82.8 fl (ref 78.0–100.0)
Monocytes Absolute: 0.5 K/uL (ref 0.1–1.0)
Monocytes Relative: 6.9 % (ref 3.0–12.0)
Neutro Abs: 3.7 K/uL (ref 1.4–7.7)
Neutrophils Relative %: 53.1 % (ref 43.0–77.0)
Platelets: 270 K/uL (ref 150.0–400.0)
RBC: 5.39 Mil/uL (ref 4.22–5.81)
RDW: 13.2 % (ref 11.5–15.5)
WBC: 7 K/uL (ref 4.0–10.5)

## 2024-01-18 LAB — SEDIMENTATION RATE: Sed Rate: 12 mm/h (ref 0–15)

## 2024-01-18 LAB — COMPREHENSIVE METABOLIC PANEL WITH GFR
ALT: 28 U/L (ref 0–53)
AST: 20 U/L (ref 0–37)
Albumin: 4.5 g/dL (ref 3.5–5.2)
Alkaline Phosphatase: 59 U/L (ref 39–117)
BUN: 15 mg/dL (ref 6–23)
CO2: 26 meq/L (ref 19–32)
Calcium: 9.4 mg/dL (ref 8.4–10.5)
Chloride: 104 meq/L (ref 96–112)
Creatinine, Ser: 1.01 mg/dL (ref 0.40–1.50)
GFR: 94.98 mL/min (ref >60.00)
Glucose, Bld: 90 mg/dL (ref 70–99)
Potassium: 3.9 meq/L (ref 3.5–5.1)
Sodium: 139 meq/L (ref 135–145)
Total Bilirubin: 0.3 mg/dL (ref 0.2–1.2)
Total Protein: 7.8 g/dL (ref 6.0–8.3)

## 2024-01-18 NOTE — Patient Instructions (Addendum)
 Your provider has requested that you go to the basement level for lab work before leaving today. Press B on the elevator. The lab is located at the first door on the left as you exit the elevator.  Due to recent changes in healthcare laws, you may see the results of your imaging and laboratory studies on MyChart before your provider has had a chance to review them.  We understand that in some cases there may be results that are confusing or concerning to you. Not all laboratory results come back in the same time frame and the provider may be waiting for multiple results in order to interpret others.  Please give us  48 hours in order for your provider to thoroughly review all the results before contacting the office for clarification of your results.   Follow up as needed.  Thank you for trusting me with your gastrointestinal care!   Alan Coombs, PA-C   _______________________________________________________  If your blood pressure at your visit was 140/90 or greater, please contact your primary care physician to follow up on this.  _______________________________________________________  If you are age 37 or older, your body mass index should be between 23-30. Your Body mass index is 33.12 kg/m. If this is out of the aforementioned range listed, please consider follow up with your Primary Care Provider.  If you are age 37 or younger, your body mass index should be between 19-25. Your Body mass index is 33.12 kg/m. If this is out of the aformentioned range listed, please consider follow up with your Primary Care Provider.   ________________________________________________________  The [facility] providers would like to encourage you to use MYCHART to communicate with providers for non-urgent requests or questions.  Due to long hold times on the telephone, sending your provider a message by [facility] may be a faster and more efficient way to get a response.  Please allow 48 business hours  for a response.  Please remember that this is for non-urgent requests.  _______________________________________________________  [facility] is using a team-based approach to care.  Your team is made up of your doctor and two to three APPS. Our APPS (Nurse Practitioners and Physician Assistants) work with your physician to ensure care continuity for you. They are fully qualified to address your health concerns and develop a treatment plan. They communicate directly with your gastroenterologist to care for you. Seeing the Advanced Practice Practitioners on your physician's team can help you by facilitating care more promptly, often allowing for earlier appointments, access to diagnostic testing, procedures, and other specialty referrals.    Diverticulosis Diverticulosis is a condition that develops when small pouches (diverticula) form in the wall of the large intestine (colon). The colon is where water is absorbed and stool (feces) is formed. The pouches form when the inside layer of the colon pushes through weak spots in the outer layers of the colon. You may have a few pouches or many of them. The pouches usually do not cause problems unless they become inflamed or infected. When this happens, the condition is called diverticulitis- this is left lower quadrant pain, diarrhea, fever, chills, nausea or vomiting.  If this occurs please call the office or go to the hospital. Sometimes these patches without inflammation can also have painless bleeding associated with them, if this happens please call the office or go to the hospital. Preventing constipation and increasing fiber can help reduce diverticula and prevent complications. Even if you feel you have a high-fiber diet, suggest getting on  Benefiber or Cirtracel 2 times daily. First do a trial off milk/lactose products if you use them.  Add fiber like benefiber or citracel once a day Increase activity Can do trial of IBGard which is  over the counter for AB pain- Take 1-2 capsules once a day for maintence or twice a day during a flare Please try to decrease stress. consider talking with PCP about anti anxiety medication or try head space app for meditation. if any worsening symptoms like blood in stool, weight loss, please call the office   FODMAP stands for fermentable oligo-, di-, mono-saccharides and polyols (1). These are the scientific terms used to classify groups of carbs that are difficult for our body to digest and that are notorious for triggering digestive symptoms like bloating, gas, loose stools and stomach pain.  You can try low FODMAP diet  - start with eliminating just one column at a time that you feel may be a trigger for you. - the table at the very bottom contains foods that are low in FODMAPs Sometimes trying to eliminate the FODMAP's from your diet is difficult or tricky, if you are stuggling with trying to do the elimination diet you can try an enzyme.  There is a food enzymes that you sprinkle in or on your food that helps break down the FODMAP. You can read more about the enzyme by going to this site: https://fodzyme.com/

## 2024-01-18 NOTE — Progress Notes (Signed)
 01/18/2024 Christopher Carney [id number] 16-Jan-1986  Referring provider: Stephane Leita DEL, MD Primary GI doctor: Dr. Federico  ASSESSMENT AND PLAN:  Diarrhea/bloating For 6 + days in July with some nausea, denies fever, chills Resolved on its own BM daily, no hemoatchezia, no weight loss Likely infectious that has resolved -check celiac, complements, ESR  History of diverticulitis 11/06/2020 CTAP W scrotal pain acute rectosigmoid diverticulitis Treated Cipro  Flagyl  No symptoms at this time - add on fiber  Personal history of tubular adenomatous polyps 09/19/2021 colonoscopy good prep normal TI, 2 mm polyp transverse, 4 mm polyp transverse, 3 mm polyp sigmoid, 15 mm polyp sigmoid diverticulosis nonbleeding internal hemorrhoids TA polyps  recall 3 years  Patient Care Team: Stephane Leita DEL, MD as PCP - General (Internal Medicine)  HISTORY OF PRESENT ILLNESS: 38 y.o. male with a past medical history listed below presents for evaluation of diarrhea/bloating.   Last seen in the office 07/15/2021 by Dr. Federico for diverticulitis  Discussed the use of AI scribe software for clinical note transcription with the patient, who gave verbal consent to proceed.  History of Present Illness   Christopher Carney is a 38 year old male who presents for follow-up after a recent episode of diarrhea.  He has a history of diverticulitis and underwent a colonoscopy in 2023, which revealed multiple polyps, hemorrhoids, and diverticulosis. He is due for a follow-up colonoscopy in June 2026.  In July 2025, he experienced an episode of diarrhea lasting six to seven days, which resolved without further intervention. During this episode, he visited urgent care where it was suggested that the cause might be viral. The symptoms resolved within a couple of days without further issues.  He reports no current symptoms of diarrhea, abdominal pain, or blood in the stool. His bowel movements are regular and well-formed, with  occasional minor irregularities. No nausea, vomiting, heartburn, or unintentional weight loss. He has recently stopped drinking alcohol, approximately three months ago, and is currently on a diet.  He mentions experiencing an intermittent rash that appears after eating, which is itchy and located in a specific area. He has consulted a dermatologist who did not find it concerning. The rash does not occur frequently, and he is unsure of any specific food triggers. No mucus, sore throat, or sinus issues associated with the rash.      He  reports that he has never smoked. His smokeless tobacco use includes snuff. He reports current alcohol use. He reports that he does not use drugs.  RELEVANT GI HISTORY, IMAGING AND LABS: Results   DIAGNOSTIC Colonoscopy: Multiple polyps, hemorrhoids, diverticulosis (2023)      CBC    Component Value Date/Time   WBC 12.1 (H) 11/05/2020 2232   RBC 5.22 11/05/2020 2232   HGB 15.3 11/05/2020 2232   HCT 43.9 11/05/2020 2232   PLT 263 11/05/2020 2232   MCV 84.1 11/05/2020 2232   MCH 29.3 11/05/2020 2232   MCHC 34.9 11/05/2020 2232   RDW 13.2 11/05/2020 2232   No results for input(s): HGB in the last 8760 hours.  CMP     Component Value Date/Time   NA 138 11/05/2020 2232   K 3.9 11/05/2020 2232   CL 104 11/05/2020 2232   CO2 24 11/05/2020 2232   GLUCOSE 106 (H) 11/05/2020 2232   BUN 16 11/05/2020 2232   CREATININE 0.99 11/05/2020 2232   CALCIUM 9.4 11/05/2020 2232   PROT 7.8 11/05/2020 2232   ALBUMIN 4.3 11/05/2020 2232  AST 20 11/05/2020 2232   ALT 20 11/05/2020 2232   ALKPHOS 48 11/05/2020 2232   BILITOT 1.0 11/05/2020 2232   GFRNONAA >60 11/05/2020 2232      Latest Ref Rng & Units 11/05/2020   10:32 PM  Hepatic Function  Total Protein 6.5 - 8.1 g/dL 7.8   Albumin 3.5 - 5.0 g/dL 4.3   AST 15 - 41 U/L 20   ALT 0 - 44 U/L 20   Alk Phosphatase 38 - 126 U/L 48   Total Bilirubin 0.3 - 1.2 mg/dL 1.0       Current Medications:     Current Outpatient Medications (Cardiovascular):    rosuvastatin (CRESTOR) 10 MG tablet, Take 10 mg by mouth at bedtime. (Patient not taking: Reported on 01/18/2024)   Current Outpatient Medications (Analgesics):    allopurinol (ZYLOPRIM) 100 MG tablet, Take 200 mg by mouth as needed.   indomethacin (INDOCIN) 50 MG capsule, 1 capsule with food or milk (Patient taking differently: As needed)   Current Outpatient Medications (Other):    FLOWFLEX COVID-19 AG HOME TEST KIT, FOLLOW THE test instructions FOR using THE at-home covid test.   Omega-3 Fatty Acids (FISH OIL) 1000 MG CAPS, 1 capsule   sertraline (ZOLOFT) 50 MG tablet, Take 50 mg by mouth daily. (Patient taking differently: Take 50 mg by mouth daily. As needed)   amphetamine-dextroamphetamine (ADDERALL) 15 MG tablet, Take 1 tablet by mouth 3 (three) times daily. (Patient not taking: Reported on 01/18/2024)  Medical History:  Past Medical History:  Diagnosis Date   Allergy    Anxiety    Attention deficit disorder    Depression    Diverticulitis    GERD (gastroesophageal reflux disease)    Hyperglycemia    Hyperlipidemia    Upper respiratory tract infection    Allergies: Not on File   Surgical History:  He  has a past surgical history that includes Shoulder surgery (2009). Family History:  His family history includes Colon cancer in his maternal grandfather; Diabetes in his maternal grandfather; Fibromyalgia in his mother; Heart attack in his paternal grandfather; Hyperlipidemia in his father and paternal grandfather; Hypertension in his father; Hypothyroidism in his mother.  REVIEW OF SYSTEMS  : All other systems reviewed and negative except where noted in the History of Present Illness.  PHYSICAL EXAM: BP (!) 140/80   Pulse 84   Ht 6' 1 (1.854 m)   Wt 251 lb (113.9 kg)   BMI 33.12 kg/m  Physical Exam   GENERAL APPEARANCE: Well nourished, in no apparent distress. HEENT: No cervical lymphadenopathy, unremarkable  thyroid, sclerae anicteric, conjunctiva pink. RESPIRATORY: Respiratory effort normal, breath sounds equal bilaterally without rales, rhonchi, or wheezing. CARDIO: Regular rate and rhythm with no murmurs, rubs, or gallops, peripheral pulses intact. ABDOMEN: Soft, non-distended, active bowel sounds in all four quadrants, non-tender to palpation, no rebound tenderness, no masses appreciated. RECTAL: Declines examination. MUSCULOSKELETAL: Full range of motion, normal gait, without edema. SKIN: Dry, intact without rashes or lesions. No jaundice. NEURO: Alert, oriented, no focal deficits. PSYCH: Cooperative, normal mood and affect.      Alan JONELLE Coombs, PA-C 3:48 PM

## 2024-01-19 ENCOUNTER — Ambulatory Visit: Payer: Self-pay | Admitting: Physician Assistant

## 2024-01-22 NOTE — Progress Notes (Signed)
 No answer no voice mail

## 2024-01-24 LAB — C3 AND C4
C3 Complement: 167 mg/dL (ref 82–185)
C4 Complement: 23 mg/dL (ref 15–53)

## 2024-01-24 LAB — C1 ESTERASE INHIBITOR, FUNCTIONAL: C1 Esterase Inhibitor Funct: >100 % (ref >=68)

## 2024-01-24 LAB — IGA: Immunoglobulin A: 347 mg/dL — ABNORMAL HIGH (ref 47–310)

## 2024-01-24 LAB — TISSUE TRANSGLUTAMINASE, IGA: (tTG) Ab, IgA: <1 U/mL
